# Patient Record
Sex: Male | Born: 1960
Health system: Southern US, Community
[De-identification: ages and names within clinical notes are randomized; demographics above are authoritative.]

## PROBLEM LIST (undated history)

## (undated) DIAGNOSIS — K635 Polyp of colon: Secondary | ICD-10-CM

## (undated) DIAGNOSIS — K219 Gastro-esophageal reflux disease without esophagitis: Secondary | ICD-10-CM

## (undated) HISTORY — DX: Gastro-esophageal reflux disease without esophagitis: K21.9

## (undated) HISTORY — DX: Polyp of colon: K63.5

---

## 2011-05-10 DIAGNOSIS — K219 Gastro-esophageal reflux disease without esophagitis: Secondary | ICD-10-CM | POA: Insufficient documentation

## 2011-05-10 DIAGNOSIS — K635 Polyp of colon: Secondary | ICD-10-CM | POA: Insufficient documentation

## 2011-05-10 DIAGNOSIS — Z720 Tobacco use: Secondary | ICD-10-CM | POA: Insufficient documentation

## 2015-07-24 ENCOUNTER — Encounter: Payer: Self-pay | Admitting: Family Medicine

## 2015-07-24 ENCOUNTER — Ambulatory Visit (INDEPENDENT_AMBULATORY_CARE_PROVIDER_SITE_OTHER): Payer: BLUE CROSS/BLUE SHIELD | Admitting: Family Medicine

## 2015-07-24 VITALS — BP 148/92 | HR 103 | Temp 97.8°F | Ht 71.0 in | Wt 152.0 lb

## 2015-07-24 DIAGNOSIS — Z1211 Encounter for screening for malignant neoplasm of colon: Secondary | ICD-10-CM

## 2015-07-24 DIAGNOSIS — D229 Melanocytic nevi, unspecified: Secondary | ICD-10-CM

## 2015-07-24 DIAGNOSIS — Z7189 Other specified counseling: Secondary | ICD-10-CM | POA: Diagnosis not present

## 2015-07-24 DIAGNOSIS — Z7689 Persons encountering health services in other specified circumstances: Secondary | ICD-10-CM

## 2015-07-24 DIAGNOSIS — K219 Gastro-esophageal reflux disease without esophagitis: Secondary | ICD-10-CM

## 2015-07-24 LAB — HEMOCCULT GUIAC POC 1CARD (OFFICE): FECAL OCCULT BLD: NEGATIVE

## 2015-07-24 MED ORDER — FAMOTIDINE 10 MG PO TABS: 10.0000 mg | ORAL_TABLET | Freq: Every day | ORAL | Status: DC

## 2015-07-24 NOTE — Progress Notes (Signed)
Name: Paul Logan   MRN: [id number]    DOB: [birth year]   Date:07/24/2015       Progress Note  Subjective  Chief Complaint  Chief Complaint  Patient presents with  . Establish Care    hasn't had a PCP in many years  . Mole    on right leg, has been there several years, it's changed. Would like it checked.    HPI Comments: Patient to establish primary care.   No problem-specific assessment & plan notes found for this encounter.   Past Medical History  Diagnosis Date  . GERD (gastroesophageal reflux disease)   . Colon polyp, hyperplastic     History reviewed. No pertinent past surgical history.  Family History  Problem Relation Age of Onset  . Cervical cancer Mother   . Hypertension Mother   . Cancer Mother     cervical  . Emphysema Mother   . Hypothyroidism Father   . Hypertension Father   . COPD Father   . Heart disease Father     pacemaker  . Stroke Father   . Prostate cancer Maternal Uncle   . Hypertension Maternal Grandfather   . Stroke Maternal Grandfather   . Heart disease Paternal Grandfather   . Diabetes Neg Hx     Social History   Social History  . Marital Status: Single    Spouse Name: N/A  . Number of Children: N/A  . Years of Education: N/A   Occupational History  . Not on file.   Social History Main Topics  . Smoking status: Current Every Day Smoker -- 1.50 packs/day    Types: Cigarettes  . Smokeless tobacco: Never Used  . Alcohol Use: 18.0 oz/week    30 Cans of beer per week  . Drug Use: No  . Sexual Activity: Not on file   Other Topics Concern  . Not on file   Social History Narrative  . No narrative on file    No Known Allergies   Review of Systems  Constitutional: Negative for fever, chills, weight loss and malaise/fatigue.  HENT: Negative for ear discharge, ear pain and sore throat.   Eyes: Negative for blurred vision.  Respiratory: Negative for cough, sputum production, shortness of breath and wheezing.    Cardiovascular: Negative for chest pain, palpitations and leg swelling.  Gastrointestinal: Negative for heartburn, nausea, abdominal pain, diarrhea, constipation, blood in stool and melena.  Genitourinary: Negative for dysuria, urgency, frequency and hematuria.  Musculoskeletal: Negative for myalgias, back pain, joint pain and neck pain.  Skin: Negative for rash.  Neurological: Negative for dizziness, tingling, sensory change, focal weakness and headaches.  Endo/Heme/Allergies: Negative for environmental allergies and polydipsia. Does not bruise/bleed easily.  Psychiatric/Behavioral: Negative for depression and suicidal ideas. The patient is not nervous/anxious and does not have insomnia.      Objective  Filed Vitals:   07/24/15 1504  BP: 162/98  Pulse: 103  Temp: 97.8 F (36.6 C)  Weight: 152 lb (68.947 kg)  SpO2: 99%    Physical Exam  Constitutional: He is oriented to person, place, and time and well-developed, well-nourished, and in no distress.  HENT:  Head: Normocephalic.  Right Ear: External ear normal.  Left Ear: External ear normal.  Nose: Nose normal.  Mouth/Throat: Oropharynx is clear and moist.  Eyes: Conjunctivae and EOM are normal. Pupils are equal, round, and reactive to light. Right eye exhibits no discharge. Left eye exhibits no discharge. No scleral icterus.  Neck: Normal range of motion. Neck  supple. No JVD present. No tracheal deviation present. No thyromegaly present.  Cardiovascular: Normal rate, regular rhythm, normal heart sounds and intact distal pulses.  Exam reveals no gallop and no friction rub.   No murmur heard. Pulmonary/Chest: Breath sounds normal. No respiratory distress. He has no wheezes. He has no rales.  Abdominal: Soft. Bowel sounds are normal. He exhibits no mass. There is no hepatosplenomegaly. There is no tenderness. There is no rebound, no guarding and no CVA tenderness.  Genitourinary: Prostate normal. Rectal exam shows external  hemorrhoid. Rectal exam shows no mass and no tenderness.  Musculoskeletal: Normal range of motion. He exhibits no edema or tenderness.  Lymphadenopathy:    He has no cervical adenopathy.  Neurological: He is alert and oriented to person, place, and time. He has normal sensation, normal strength, normal reflexes and intact cranial nerves. No cranial nerve deficit.  Skin: Skin is warm. Rash noted. Rash is nodular.  Bluish with irregularity right lower leg  Psychiatric: Mood and affect normal.  Nursing note and vitals reviewed.     Assessment & Plan  Problem List Items Addressed This Visit    None    Visit Diagnoses    Encounter to establish care with new doctor    -  Primary    Colon cancer screening        Relevant Orders    POCT Occult Blood Stool    Nevus        Relevant Orders    Ambulatory referral to Dermatology    Gastroesophageal reflux disease, esophagitis presence not specified        Relevant Medications    famotidine (PEPCID) 10 MG tablet         Dr. Thaddus Mcdowell [facility]  07/24/2015

## 2015-09-27 ENCOUNTER — Ambulatory Visit (INDEPENDENT_AMBULATORY_CARE_PROVIDER_SITE_OTHER): Payer: BLUE CROSS/BLUE SHIELD

## 2015-09-27 ENCOUNTER — Encounter: Payer: Self-pay | Admitting: Emergency Medicine

## 2015-09-27 ENCOUNTER — Ambulatory Visit
Admission: EM | Admit: 2015-09-27 | Discharge: 2015-09-27 | Disposition: A | Discharge status: Home / Self Care | Payer: BLUE CROSS/BLUE SHIELD | Attending: Family Medicine | Admitting: Family Medicine

## 2015-09-27 DIAGNOSIS — S0083XA Contusion of other part of head, initial encounter: Secondary | ICD-10-CM

## 2015-09-27 DIAGNOSIS — W19XXXA Unspecified fall, initial encounter: Secondary | ICD-10-CM

## 2015-09-27 DIAGNOSIS — S2231XA Fracture of one rib, right side, initial encounter for closed fracture: Secondary | ICD-10-CM

## 2015-09-27 MED ORDER — HYDROCODONE-ACETAMINOPHEN 5-325 MG PO TABS: ORAL_TABLET | ORAL | 0 refills | Status: DC

## 2015-09-27 NOTE — ED Triage Notes (Signed)
Patient states that he fell at home a week ago.  Patient thinks when he fell he knocked himself out.  Patient states that he does not remember falling.  Patient c/o some pain on the right side of his forehead and right sided rib pain.

## 2015-09-27 NOTE — ED Provider Notes (Signed)
[facility]    CSN: [id number] Arrival date & time: 09/27/15  1341  First Provider Contact:  None       History   Chief Complaint Chief Complaint  Patient presents with  . Fall  . Rib Pain    HPI Paul Logan is a 55 y.o. male.   Patient states that he fell at home a week ago. Patient c/o some pain on the right side of his forehead and right sided rib pain. First medical evaluation since falling at home one week ago. Denies any vision changes, vomiting, numbness/tingling, fevers, chills. Complains of pain to right lower ribs.    The history is provided by the patient.    Past Medical History:  Diagnosis Date  . Colon polyp, hyperplastic   . GERD (gastroesophageal reflux disease)     There are no active problems to display for this patient.   History reviewed. No pertinent surgical history.     Home Medications    Prior to Admission medications   Medication Sig Start Date End Date Taking? Authorizing Provider  famotidine (PEPCID) 10 MG tablet Take 1 tablet (10 mg total) by mouth daily. 07/24/15   Juline Patch, MD  HYDROcodone-acetaminophen (NORCO/VICODIN) 5-325 MG tablet 1-2 tabs po bid prn pain 09/27/15   Norval Gable, MD    Family History Family History  Problem Relation Age of Onset  . Cervical cancer Mother   . Hypertension Mother   . Cancer Mother     cervical  . Emphysema Mother   . Hypothyroidism Father   . Hypertension Father   . COPD Father   . Heart disease Father     pacemaker  . Stroke Father   . Prostate cancer Maternal Uncle   . Hypertension Maternal Grandfather   . Stroke Maternal Grandfather   . Heart disease Paternal Grandfather   . Diabetes Neg Hx     Social History Social History  Substance Use Topics  . Smoking status: Current Every Day Smoker    Packs/day: 1.50    Types: Cigarettes  . Smokeless tobacco: Never Used  . Alcohol use 18.0 oz/week    30 Cans of beer per week     Allergies   Review of  patient's allergies indicates no known allergies.   Review of Systems Review of Systems   Physical Exam Triage Vital Signs ED Triage Vitals  Enc Vitals Group     BP 09/27/15 1423 (!) 137/91     Pulse Rate 09/27/15 1423 87     Resp 09/27/15 1423 16     Temp 09/27/15 1423 97 F (36.1 C)     Temp Source 09/27/15 1423 Tympanic     SpO2 09/27/15 1423 100 %     Weight 09/27/15 1424 152 lb (68.9 kg)     Height 09/27/15 1424 6\' 2"  (1.88 m)     Head Circumference --      Peak Flow --      Pain Score 09/27/15 1425 7     Pain Loc --      Pain Edu? --      Excl. in Lane? --    No data found.   Updated Vital Signs BP (!) 137/91 (BP Location: Left Arm)   Pulse 87   Temp 97 F (36.1 C) (Tympanic)   Resp 16   Ht 6\' 2"  (1.88 m)   Wt 152 lb (68.9 kg)   SpO2 100%   BMI 19.52 kg/m   Visual  Acuity Right Eye Distance:   Left Eye Distance:   Bilateral Distance:    Right Eye Near:   Left Eye Near:    Bilateral Near:     Physical Exam  Constitutional: He is oriented to person, place, and time. He appears well-developed and well-nourished. No distress.  HENT:  Head: Normocephalic and atraumatic.  Right Ear: Tympanic membrane, external ear and ear canal normal.  Left Ear: Tympanic membrane, external ear and ear canal normal.  Nose: Nose normal.  Mouth/Throat: Uvula is midline, oropharynx is clear and moist and mucous membranes are normal. No oropharyngeal exudate or tonsillar abscesses.  Eyes: Conjunctivae and EOM are normal. Pupils are equal, round, and reactive to light. Right eye exhibits no discharge. Left eye exhibits no discharge. No scleral icterus.  Neck: Normal range of motion. Neck supple. No tracheal deviation present. No thyromegaly present.  Cardiovascular: Normal rate, regular rhythm and normal heart sounds.   Pulmonary/Chest: Effort normal and breath sounds normal. No stridor. No respiratory distress. He has no wheezes. He has no rales. He exhibits tenderness (over  right anterior lower ribs).  Lymphadenopathy:    He has no cervical adenopathy.  Neurological: He is alert and oriented to person, place, and time. He has normal reflexes. He displays normal reflexes. No cranial nerve deficit. He exhibits normal muscle tone. Coordination normal.  Skin: Skin is warm and dry. No rash noted. He is not diaphoretic.  Nursing note and vitals reviewed.    UC Treatments / Results  Labs (all labs ordered are listed, but only abnormal results are displayed) Labs Reviewed - No data to display  EKG  EKG Interpretation None       Radiology Dg Ribs Unilateral W/chest Right  Result Date: 09/27/2015 CLINICAL DATA:  Patient states that he fell at home a week ago. Patient thinks when he fell he knocked himself out. Patient does not remember falling. Patient c/o right sided rib pain. EXAM: RIGHT RIBS AND CHEST - 3+ VIEW COMPARISON:  None. FINDINGS: There are nondisplaced fractures of the anterior rib ends of the right ninth and tenth ribs. No other fractures. Lungs are hyperexpanded but clear. No pleural effusion or pneumothorax. Heart, mediastinum and hila are unremarkable. IMPRESSION: 1. Nondisplaced fractures of the anterior rib ends of the right ninth and tenth ribs. The no other fractures. 2. No acute cardiopulmonary disease. No pneumothorax or pleural effusion. Electronically Signed   By: Lajean Manes M.D.   On: 09/27/2015 15:23    Procedures Procedures (including critical care time)  Medications Ordered in UC Medications - No data to display   Initial Impression / Assessment and Plan / UC Course  I have reviewed the triage vital signs and the nursing notes.  Pertinent labs & imaging results that were available during my care of the patient were reviewed by me and considered in my medical decision making (see chart for details).  Clinical Course      Final Clinical Impressions(s) / UC Diagnoses   Final diagnoses:  Rib fracture, right, closed,  initial encounter  Fall, initial encounter  Facial contusion, initial encounter    New Prescriptions Discharge Medication List as of 09/27/2015  3:47 PM    START taking these medications   Details  HYDROcodone-acetaminophen (NORCO/VICODIN) 5-325 MG tablet 1-2 tabs po bid prn pain, Print       1.x-ray results and diagnosis reviewed with patient 2. rx as per orders above; reviewed possible side effects, interactions, risks and benefits  3. Recommend supportive  treatment with ice, rest, otc analgesics prn 4. Follow-up prn if symptoms worsen or don't improve   Norval Gable, MD 09/27/15 1625

## 2015-10-24 ENCOUNTER — Encounter: Payer: Self-pay | Admitting: Family Medicine

## 2015-10-24 ENCOUNTER — Ambulatory Visit (INDEPENDENT_AMBULATORY_CARE_PROVIDER_SITE_OTHER): Payer: BLUE CROSS/BLUE SHIELD | Admitting: Family Medicine

## 2015-10-24 VITALS — BP 120/80 | HR 64 | Ht 73.0 in | Wt 156.0 lb

## 2015-10-24 DIAGNOSIS — Z0000 Encounter for general adult medical examination without abnormal findings: Secondary | ICD-10-CM

## 2015-10-24 DIAGNOSIS — Z1211 Encounter for screening for malignant neoplasm of colon: Secondary | ICD-10-CM

## 2015-10-24 DIAGNOSIS — Z23 Encounter for immunization: Secondary | ICD-10-CM

## 2015-10-24 LAB — HEMOCCULT GUIAC POC 1CARD (OFFICE): FECAL OCCULT BLD: NEGATIVE

## 2015-10-24 NOTE — Progress Notes (Signed)
Name: Paul Logan   MRN: [id number]    DOB: 1960-02-21   Date:10/24/2015       Progress Note  Subjective  Chief Complaint  Chief Complaint  Patient presents with  . Annual Exam    needs repeat colonoscopy in [place]- last was with Tollie Pizza at [facility]    Patient presents for annual physical exam.    No problem-specific Assessment & Plan notes found for this encounter.   Past Medical History:  Diagnosis Date  . Colon polyp, hyperplastic   . GERD (gastroesophageal reflux disease)     History reviewed. No pertinent surgical history.  Family History  Problem Relation Age of Onset  . Cervical cancer Mother   . Hypertension Mother   . Cancer Mother     cervical  . Emphysema Mother   . Hypothyroidism Father   . Hypertension Father   . COPD Father   . Heart disease Father     pacemaker  . Stroke Father   . Prostate cancer Maternal Uncle   . Hypertension Maternal Grandfather   . Stroke Maternal Grandfather   . Heart disease Paternal Grandfather   . Diabetes Neg Hx     Social History   Social History  . Marital status: Single    Spouse name: N/A  . Number of children: N/A  . Years of education: N/A   Occupational History  . Not on file.   Social History Main Topics  . Smoking status: Current Every Day Smoker    Packs/day: 1.50    Types: Cigarettes  . Smokeless tobacco: Never Used  . Alcohol use 18.0 oz/week    30 Cans of beer per week  . Drug use: No  . Sexual activity: Not on file   Other Topics Concern  . Not on file   Social History Narrative  . No narrative on file    No Known Allergies   Review of Systems  Constitutional: Negative for chills, fever, malaise/fatigue and weight loss.  HENT: Negative for ear discharge, ear pain and sore throat.   Eyes: Negative for blurred vision.  Respiratory: Negative for cough, sputum production, shortness of breath and wheezing.   Cardiovascular: Negative for chest pain, palpitations and  leg swelling.  Gastrointestinal: Negative for abdominal pain, blood in stool, constipation, diarrhea, heartburn, melena and nausea.  Genitourinary: Negative for dysuria, frequency, hematuria and urgency.  Musculoskeletal: Negative for back pain, joint pain, myalgias and neck pain.  Skin: Negative for rash.  Neurological: Negative for dizziness, tingling, sensory change, focal weakness and headaches.  Endo/Heme/Allergies: Negative for environmental allergies and polydipsia. Does not bruise/bleed easily.  Psychiatric/Behavioral: Negative for depression and suicidal ideas. The patient is not nervous/anxious and does not have insomnia.      Objective  Vitals:   10/24/15 0955  BP: 120/80  Pulse: 64  Weight: 156 lb (70.8 kg)  Height: 6\' 1"  (1.854 m)    Physical Exam  Constitutional: He is oriented to person, place, and time and well-developed, well-nourished, and in no distress.  HENT:  Head: Normocephalic.  Right Ear: Tympanic membrane, external ear and ear canal normal.  Left Ear: Tympanic membrane, external ear and ear canal normal.  Nose: Nose normal.  Mouth/Throat: Uvula is midline, oropharynx is clear and moist and mucous membranes are normal.  Eyes: Conjunctivae, EOM and lids are normal. Pupils are equal, round, and reactive to light. Right eye exhibits no discharge. Left eye exhibits no discharge. No scleral icterus.  Fundoscopic exam:  The right eye shows no arteriolar narrowing, no AV nicking and no hemorrhage.       The left eye shows no arteriolar narrowing, no AV nicking and no hemorrhage.  Neck: Trachea normal and normal range of motion. Neck supple. Normal carotid pulses, no hepatojugular reflux and no JVD present. Carotid bruit is not present. No tracheal deviation present. No thyromegaly present.  Cardiovascular: Normal rate, regular rhythm, S1 normal, S2 normal, normal heart sounds, intact distal pulses and normal pulses.  Exam reveals no gallop, no S3, no S4 and no  friction rub.   No murmur heard. Pulmonary/Chest: Effort normal and breath sounds normal. No respiratory distress. He has no wheezes. He has no rales. Right breast exhibits no mass. Left breast exhibits no mass.  Abdominal: Soft. Normal aorta and bowel sounds are normal. He exhibits no mass. There is no hepatosplenomegaly. There is no tenderness. There is no rebound, no guarding and no CVA tenderness.  Genitourinary: Rectum normal, prostate normal and testes/scrotum normal. Rectal exam shows guaiac negative stool.  Musculoskeletal: Normal range of motion. He exhibits no edema or tenderness.  Lymphadenopathy:       Head (right side): No submandibular adenopathy present.       Head (left side): No submandibular adenopathy present.    He has no cervical adenopathy.    He has no axillary adenopathy.  Neurological: He is alert and oriented to person, place, and time. He has normal motor skills, normal sensation, normal strength, normal reflexes and intact cranial nerves. No cranial nerve deficit.  Skin: Skin is warm. No rash noted. No erythema. No pallor.  Psychiatric: Mood, memory and affect normal.  Nursing note and vitals reviewed.     Assessment & Plan  Problem List Items Addressed This Visit    None    Visit Diagnoses    Annual physical exam    -  Primary   Relevant Orders   Renal Function Panel   Lipid Profile   POCT Occult Blood Stool   POCT urinalysis dipstick   Immunization due       Relevant Orders   Flu Vaccine QUAD 36+ mos PF IM (Fluarix & Fluzone Quad PF) (Completed)   Colon cancer screening       Relevant Orders   POCT Occult Blood Stool   Ambulatory referral to Gastroenterology        Dr. Otilio Miu [facility]  10/24/15

## 2015-10-25 LAB — RENAL FUNCTION PANEL
Albumin: 3.9 g/dL (ref 3.5–5.5)
BUN / CREAT RATIO: 8 — AB (ref 9–20)
BUN: 7 mg/dL (ref 6–24)
CO2: 26 mmol/L (ref 18–29)
CREATININE: 0.9 mg/dL (ref 0.76–1.27)
Calcium: 8.6 mg/dL — ABNORMAL LOW (ref 8.7–10.2)
Chloride: 99 mmol/L (ref 96–106)
GFR, EST AFRICAN AMERICAN: 110 mL/min/{1.73_m2} (ref >59)
GFR, EST NON AFRICAN AMERICAN: 95 mL/min/{1.73_m2} (ref >59)
GLUCOSE: 85 mg/dL (ref 65–99)
POTASSIUM: 4.3 mmol/L (ref 3.5–5.2)
Phosphorus: 4 mg/dL (ref 2.5–4.5)
SODIUM: 139 mmol/L (ref 134–144)

## 2015-10-25 LAB — LIPID PANEL
CHOL/HDL RATIO: 1.6 ratio (ref 0.0–5.0)
Cholesterol, Total: 164 mg/dL (ref 100–199)
HDL: 105 mg/dL (ref >39)
LDL CALC: 51 mg/dL (ref 0–99)
Triglycerides: 40 mg/dL (ref 0–149)
VLDL Cholesterol Cal: 8 mg/dL (ref 5–40)

## 2017-10-25 ENCOUNTER — Encounter: Payer: Self-pay | Admitting: Family Medicine

## 2017-10-25 ENCOUNTER — Ambulatory Visit: Payer: BLUE CROSS/BLUE SHIELD | Admitting: Family Medicine

## 2017-10-25 VITALS — BP 138/80 | HR 80 | Ht 73.0 in | Wt 153.0 lb

## 2017-10-25 DIAGNOSIS — Z23 Encounter for immunization: Secondary | ICD-10-CM | POA: Diagnosis not present

## 2017-10-25 DIAGNOSIS — F17200 Nicotine dependence, unspecified, uncomplicated: Secondary | ICD-10-CM

## 2017-10-25 DIAGNOSIS — K219 Gastro-esophageal reflux disease without esophagitis: Secondary | ICD-10-CM

## 2017-10-25 DIAGNOSIS — J029 Acute pharyngitis, unspecified: Secondary | ICD-10-CM | POA: Diagnosis not present

## 2017-10-25 DIAGNOSIS — J0100 Acute maxillary sinusitis, unspecified: Secondary | ICD-10-CM | POA: Diagnosis not present

## 2017-10-25 MED ORDER — AMOXICILLIN 500 MG PO CAPS: 500.0000 mg | ORAL_CAPSULE | Freq: Three times a day (TID) | ORAL | 0 refills | Status: DC

## 2017-10-25 MED ORDER — PANTOPRAZOLE SODIUM 40 MG PO TBEC: 40.0000 mg | DELAYED_RELEASE_TABLET | Freq: Every day | ORAL | 3 refills | Status: DC

## 2017-10-25 NOTE — Addendum Note (Signed)
Addended by: Juline Patch on: 10/25/2017 04:30 PM   Modules accepted: Orders, Level of Service

## 2017-10-25 NOTE — Progress Notes (Signed)
Date:  10/25/2017   Name:  Paul Logan   DOB:  05-17-1960   MRN:  [id number]   Chief Complaint: Establish Care; Sore Throat (achy throat, feels a "dull ache" no trouble swallowing food); and Flu Vaccine Sore Throat   This is a new problem. The current episode started 1 to 4 weeks ago (2 weeks). The problem has been unchanged. The pain is worse on the right side. There has been no fever. The pain is moderate. Associated symptoms include abdominal pain. Pertinent negatives include no congestion, coughing, diarrhea, drooling, ear discharge, ear pain, headaches, hoarse voice, plugged ear sensation, neck pain, shortness of breath, stridor, swollen glands, trouble swallowing or vomiting. He has tried nothing for the symptoms.     Review of Systems  Constitutional: Negative for chills and fever.  HENT: Negative for congestion, drooling, ear discharge, ear pain, hoarse voice, sore throat and trouble swallowing.   Respiratory: Negative for cough, shortness of breath, wheezing and stridor.   Cardiovascular: Negative for chest pain, palpitations and leg swelling.  Gastrointestinal: Positive for abdominal pain. Negative for blood in stool, constipation, diarrhea, nausea and vomiting.  Endocrine: Negative for polydipsia.  Genitourinary: Negative for dysuria, frequency, hematuria and urgency.  Musculoskeletal: Negative for back pain, myalgias and neck pain.  Skin: Positive for color change. Negative for rash.       Depigmented right arm and head  Allergic/Immunologic: Negative for environmental allergies.  Neurological: Negative for dizziness and headaches.  Hematological: Does not bruise/bleed easily.  Psychiatric/Behavioral: Negative for suicidal ideas. The patient is not nervous/anxious.     There are no active problems to display for this patient.   No Known Allergies  No past surgical history on file.  Social History   Tobacco Use  . Smoking status: Current Every Day Smoker   Packs/day: 1.50    Types: Cigarettes  . Smokeless tobacco: Never Used  . Tobacco comment: info given on pills and patches  Substance Use Topics  . Alcohol use: Yes    Alcohol/week: 30.0 standard drinks    Types: 30 Cans of beer per week  . Drug use: No     Medication list has been reviewed and updated.  Current Meds  Medication Sig  . famotidine (PEPCID) 10 MG tablet Take 1 tablet (10 mg total) by mouth daily.    PHQ 2/9 Scores 10/25/2017  PHQ - 2 Score 0  PHQ- 9 Score 1    Physical Exam  Constitutional: He is oriented to person, place, and time. He appears well-developed and well-nourished.  HENT:  Head: Normocephalic.  Right Ear: Hearing, external ear and ear canal normal. Tympanic membrane is retracted.  Left Ear: Hearing, tympanic membrane, external ear and ear canal normal.  Nose: Nose normal. No mucosal edema or rhinorrhea. Right sinus exhibits no maxillary sinus tenderness. Left sinus exhibits no maxillary sinus tenderness.  Mouth/Throat: Uvula is midline. Posterior oropharyngeal erythema present. No oropharyngeal exudate or posterior oropharyngeal edema.  Eyes: Pupils are equal, round, and reactive to light. Conjunctivae and EOM are normal. Right eye exhibits no discharge. Left eye exhibits no discharge. No scleral icterus.  Neck: Normal range of motion. Neck supple. No JVD present. No tracheal deviation present. No thyromegaly present.  Cardiovascular: Normal rate, regular rhythm, S1 normal, S2 normal, normal heart sounds and intact distal pulses. Exam reveals no gallop, no S3, no S4 and no friction rub.  No murmur heard. Pulmonary/Chest: Breath sounds normal. No respiratory distress. He has no decreased breath sounds.  He has no wheezes. He has no rales.  Abdominal: Soft. Bowel sounds are normal. He exhibits no mass. There is no hepatosplenomegaly. There is no tenderness. There is no rebound, no guarding and no CVA tenderness.  Musculoskeletal: Normal range of motion.  He exhibits no edema or tenderness.  Lymphadenopathy:       Head (right side): No submental, no submandibular and no tonsillar adenopathy present.       Head (left side): No submental and no submandibular adenopathy present.    He has no cervical adenopathy.       Right cervical: No superficial cervical and no posterior cervical adenopathy present.      Left cervical: No superficial cervical and no posterior cervical adenopathy present.  Neurological: He is alert and oriented to person, place, and time. He has normal strength and normal reflexes. No cranial nerve deficit.  Skin: Skin is warm. No rash noted.  Nursing note and vitals reviewed.   BP 138/80   Pulse 80   Ht 6\' 1"  (1.854 m)   Wt 153 lb (69.4 kg)   BMI 20.19 kg/m   Assessment and Plan:  1. Acute maxillary sinusitis, recurrence not specified Acute. Postnasal drainage. Prescribe amoxil 500 mg tid. - amoxicillin (AMOXIL) 500 MG capsule; Take 1 capsule (500 mg total) by mouth 3 (three) times daily.  Dispense: 30 capsule; Refill: 0  2. Tobacco dependence Patient has been advised of the health risks of smoking and counseled concerning cessation of tobacco products. I spent over 3 minutes for discussion and to answer questions.  3. Pharyngitis, unspecified etiology Mild erythema. Trial amoxil  4. Gastroesophageal reflux disease, esophagitis presence not specified GERD insufficiently treated with pepcid. Switch to pantoprozole 40 mg daily. - pantoprazole (PROTONIX) 40 MG tablet; Take 1 tablet (40 mg total) by mouth daily.  Dispense: 30 tablet; Refill: 3  Dr. [facility]  10/25/2017

## 2017-11-10 ENCOUNTER — Other Ambulatory Visit: Payer: Self-pay

## 2017-11-10 DIAGNOSIS — H6980 Other specified disorders of Eustachian tube, unspecified ear: Secondary | ICD-10-CM

## 2018-04-04 IMAGING — CR DG RIBS W/ CHEST 3+V*R*
4 series · 4 of 4 positions shown · non-contrast
Comparison: None.

CLINICAL DATA: Patient states that he fell at home a week ago.
remember falling. Patient c/o right sided rib pain.

EXAM:
RIGHT RIBS AND CHEST - 3+ VIEW

[chest pa (1 of 2)]
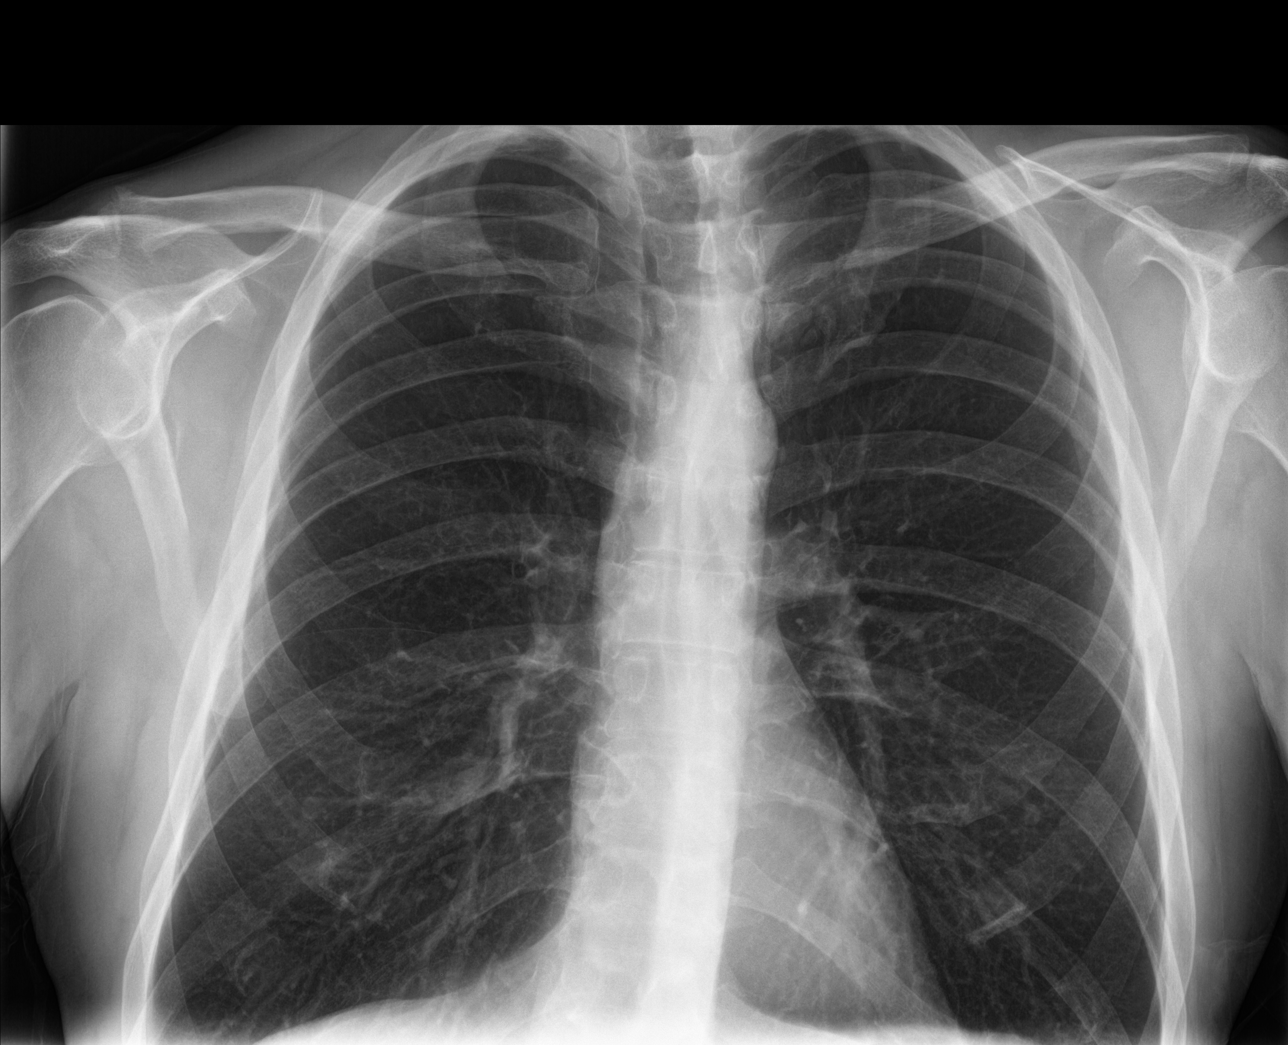

[rib pa]
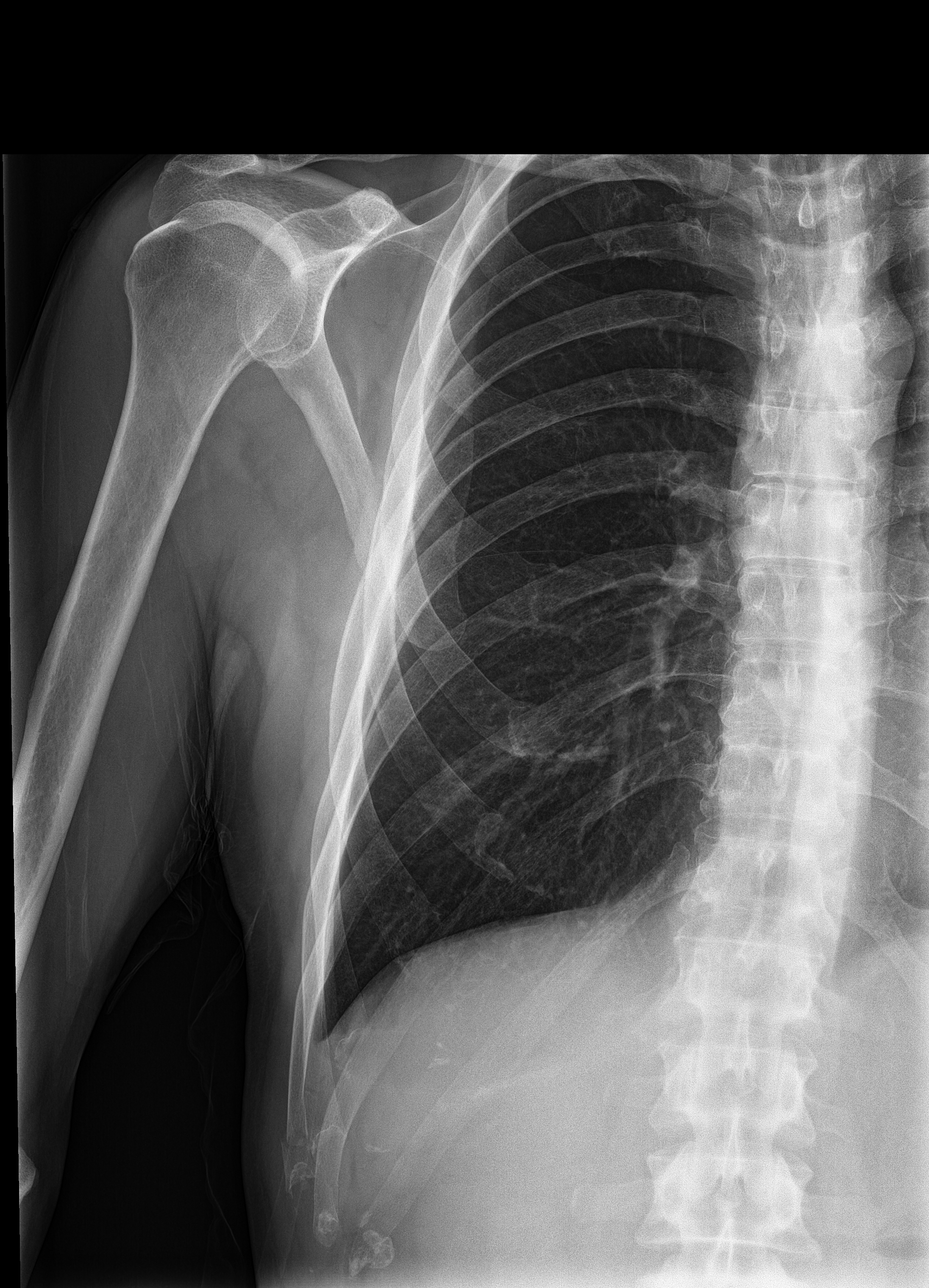

[rib obl]
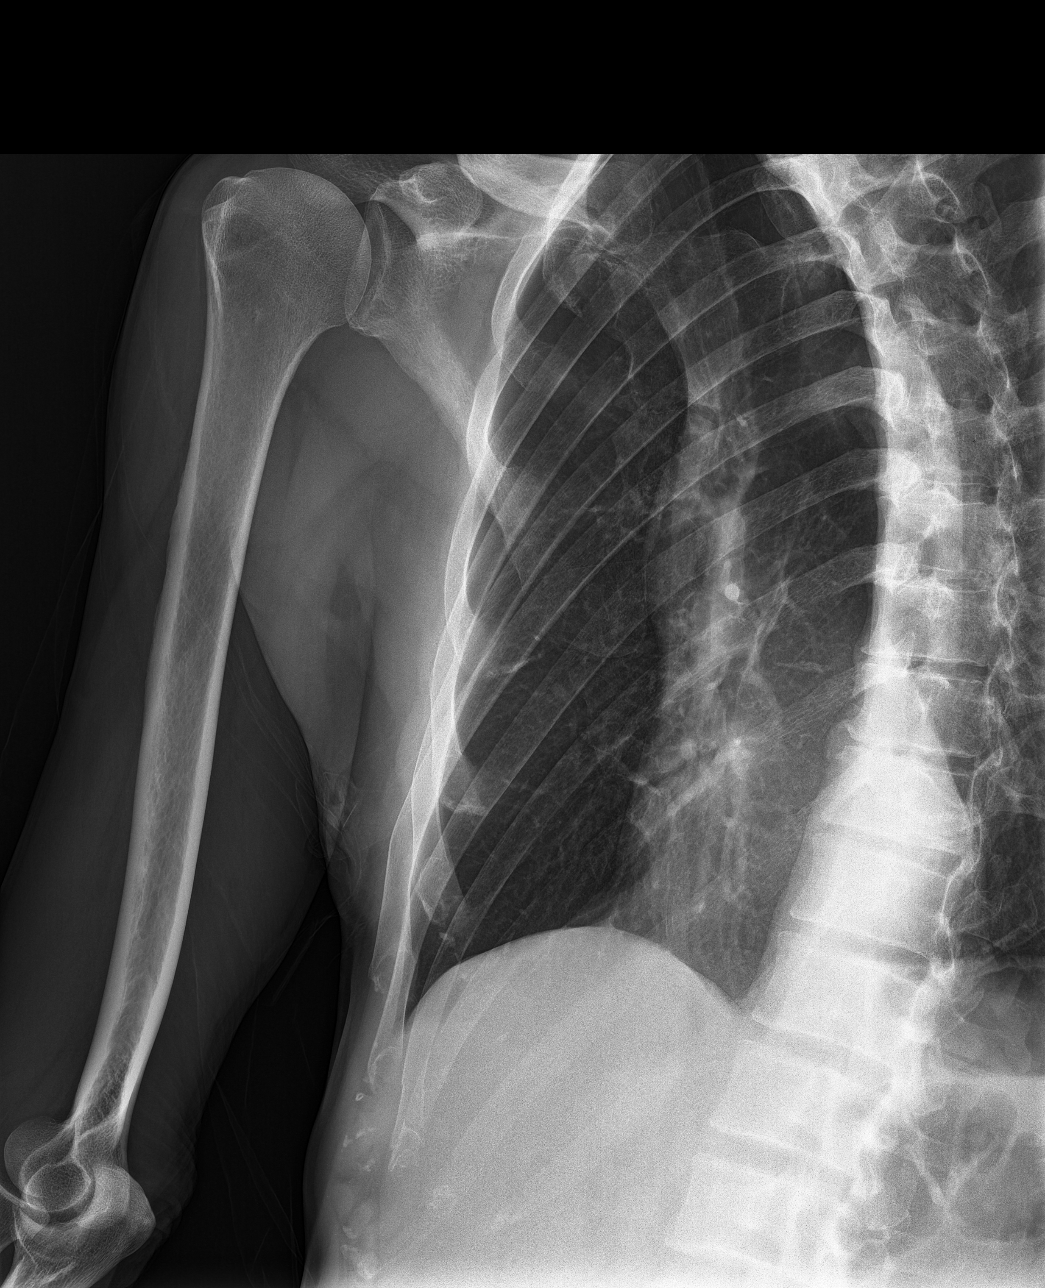

[chest pa (2 of 2)]
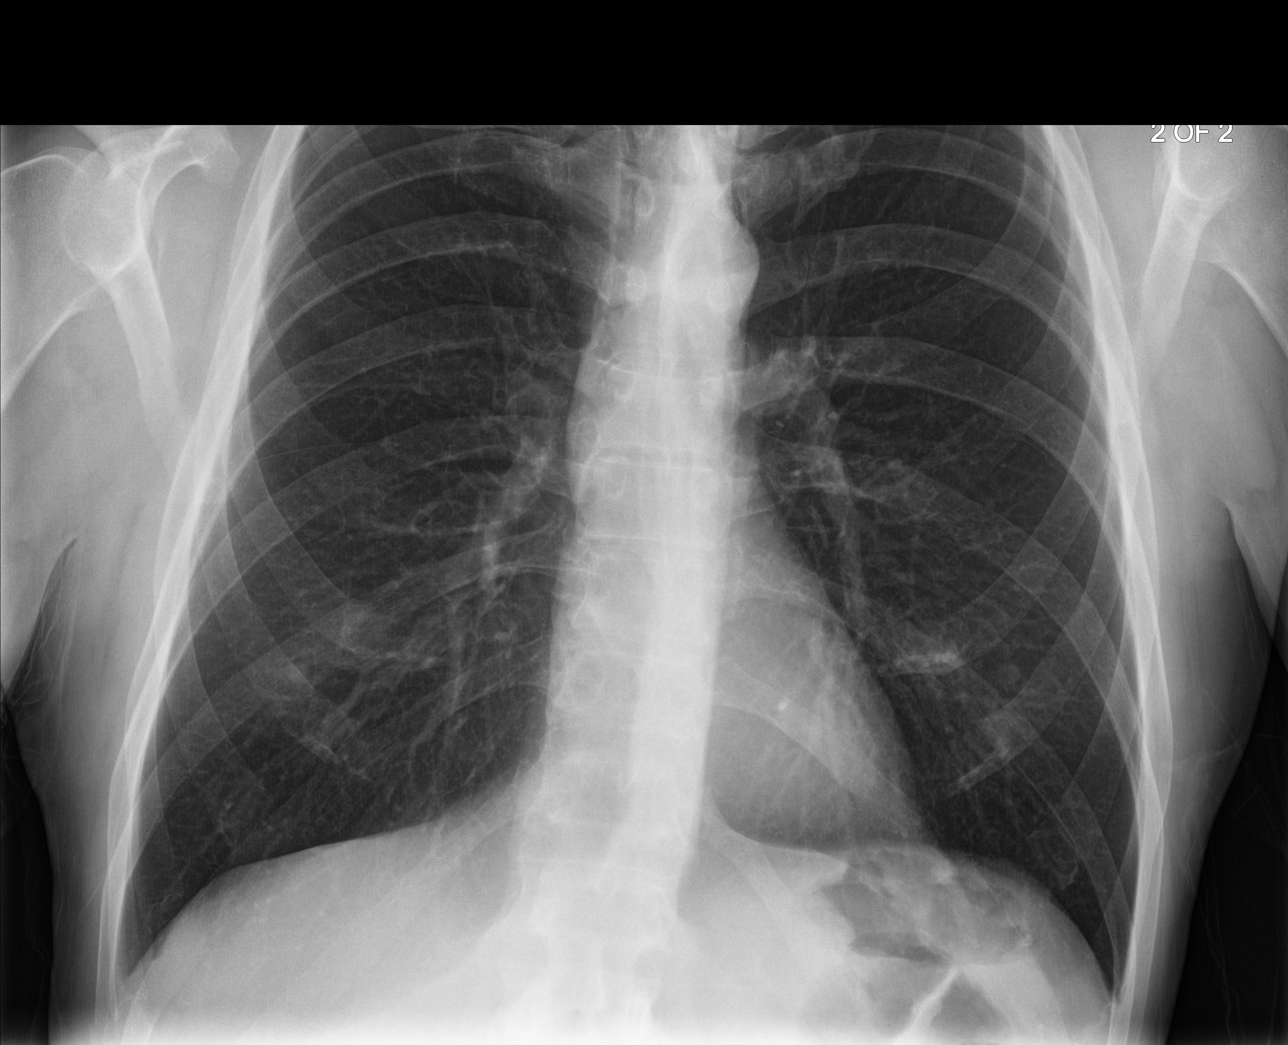

[4 of 4 positions shown; findings below may reference images not displayed]

FINDINGS: There are nondisplaced fractures of the anterior rib ends of the
right ninth and tenth ribs. No other fractures.

Lungs are hyperexpanded but clear. No pleural effusion or
pneumothorax.

Heart, mediastinum and hila are unremarkable.
IMPRESSION: 1. Nondisplaced fractures of the anterior rib ends of the right
ninth and tenth ribs. The no other fractures.
2. No acute cardiopulmonary disease. No pneumothorax or pleural
effusion.

## 2019-08-03 ENCOUNTER — Other Ambulatory Visit: Payer: Self-pay

## 2019-08-03 ENCOUNTER — Encounter: Payer: Self-pay | Admitting: Family Medicine

## 2019-08-03 ENCOUNTER — Ambulatory Visit (INDEPENDENT_AMBULATORY_CARE_PROVIDER_SITE_OTHER): Payer: Self-pay | Admitting: Family Medicine

## 2019-08-03 VITALS — BP 146/92 | HR 76 | Ht 73.0 in | Wt 150.0 lb

## 2019-08-03 DIAGNOSIS — K219 Gastro-esophageal reflux disease without esophagitis: Secondary | ICD-10-CM

## 2019-08-03 DIAGNOSIS — R1031 Right lower quadrant pain: Secondary | ICD-10-CM

## 2019-08-03 DIAGNOSIS — Z789 Other specified health status: Secondary | ICD-10-CM

## 2019-08-03 DIAGNOSIS — R195 Other fecal abnormalities: Secondary | ICD-10-CM

## 2019-08-03 MED ORDER — PANTOPRAZOLE SODIUM 40 MG PO TBEC: 40.0000 mg | DELAYED_RELEASE_TABLET | Freq: Every day | ORAL | 3 refills | Status: DC

## 2019-08-03 NOTE — Progress Notes (Signed)
Date:  08/03/2019   Name:  Paul Logan   DOB:  [birth year]   MRN:  [id number]   Chief Complaint: Abdominal Pain (Abdominal pain on the RUQ. On and off. Said he knows he is due for a colonoscopy. )  Abdominal Pain This is a new problem. The current episode started more than 1 year ago (18 months). The onset quality is gradual. The problem occurs intermittently. The problem has been unchanged. The pain is located in the RLQ. The pain is at a severity of 2/10. The pain is mild. The quality of the pain is aching. The abdominal pain does not radiate. Pertinent negatives include no constipation, diarrhea, dysuria, fever, frequency, headaches, hematochezia, hematuria, melena, myalgias, nausea or vomiting. Prior diagnostic workup includes lower endoscopy (noted polyps).    Lab Results  Component Value Date   CREATININE 0.90 10/24/2015   BUN 7 10/24/2015   NA 139 10/24/2015   K 4.3 10/24/2015   CL 99 10/24/2015   CO2 26 10/24/2015   Lab Results  Component Value Date   CHOL 164 10/24/2015   HDL 105 10/24/2015   LDLCALC 51 10/24/2015   TRIG 40 10/24/2015   CHOLHDL 1.6 10/24/2015   No results found for: TSH No results found for: HGBA1C No results found for: WBC, HGB, HCT, MCV, PLT No results found for: ALT, AST, GGT, ALKPHOS, BILITOT   Review of Systems  Constitutional: Negative for chills and fever.  HENT: Negative for drooling, ear discharge, ear pain and sore throat.   Respiratory: Negative for cough, shortness of breath and wheezing.   Cardiovascular: Negative for chest pain, palpitations and leg swelling.  Gastrointestinal: Positive for abdominal pain. Negative for blood in stool, constipation, diarrhea, hematochezia, melena, nausea and vomiting.  Endocrine: Negative for polydipsia.  Genitourinary: Negative for dysuria, frequency, hematuria and urgency.  Musculoskeletal: Negative for back pain, myalgias and neck pain.  Skin: Negative for rash.  Allergic/Immunologic:  Negative for environmental allergies.  Neurological: Negative for dizziness and headaches.  Hematological: Does not bruise/bleed easily.  Psychiatric/Behavioral: Negative for suicidal ideas. The patient is not nervous/anxious.     Patient Active Problem List   Diagnosis Date Noted   Colon polyp, hyperplastic 05/10/2011   Gastro-esophageal reflux disease without esophagitis 05/10/2011   Tobacco abuse 05/10/2011    No Known Allergies  History reviewed. No pertinent surgical history.  Social History   Tobacco Use   Smoking status: Current Every Day Smoker    Packs/day: 1.50    Types: Cigarettes   Smokeless tobacco: Never Used   Tobacco comment: info given on pills and patches  Substance Use Topics   Alcohol use: Yes    Alcohol/week: 30.0 standard drinks    Types: 30 Cans of beer per week   Drug use: No     Medication list has been reviewed and updated.  No outpatient medications have been marked as taking for the 08/03/19 encounter (Office Visit) with Juline Patch, MD.    [facility] 2/9 Scores 08/03/2019 10/25/2017  PHQ - 2 Score 0 0  PHQ- 9 Score 0 1    GAD 7 : Generalized Anxiety Score 08/03/2019  Nervous, Anxious, on Edge 0  Control/stop worrying 0  Worry too much - different things 0  Trouble relaxing 0  Restless 0  Easily annoyed or irritable 0  Afraid - awful might happen 0  Total GAD 7 Score 0  Anxiety Difficulty Not difficult at all    BP Readings from Last 3  Encounters:  08/03/19 (!) 146/92  10/25/17 138/80  10/24/15 120/80    Physical Exam Vitals and nursing note reviewed.  HENT:     Head: Normocephalic.     Jaw: There is normal jaw occlusion.     Right Ear: Tympanic membrane and external ear normal.     Left Ear: Tympanic membrane and external ear normal.     Nose: Nose normal.     Mouth/Throat:     Lips: Pink.     Mouth: Mucous membranes are moist.  Eyes:     General: Lids are normal. No scleral icterus.       Right eye: No  discharge.        Left eye: No discharge.     Conjunctiva/sclera: Conjunctivae normal.     Pupils: Pupils are equal, round, and reactive to light.  Neck:     Thyroid: No thyromegaly.     Vascular: No JVD.     Trachea: No tracheal deviation.  Cardiovascular:     Rate and Rhythm: Normal rate and regular rhythm.     Chest Wall: PMI is not displaced.     Heart sounds: Normal heart sounds, S1 normal and S2 normal. No murmur heard.  No systolic murmur is present.  No diastolic murmur is present.  No friction rub. No gallop. No S3 or S4 sounds.   Pulmonary:     Effort: No respiratory distress.     Breath sounds: Normal breath sounds. No wheezing or rales.  Abdominal:     General: Abdomen is flat. Bowel sounds are normal.     Palpations: Abdomen is soft. There is no hepatomegaly, splenomegaly or mass.     Tenderness: There is no abdominal tenderness. There is no right CVA tenderness, left CVA tenderness, guarding or rebound.  Genitourinary:    Prostate: Normal.     Rectum: Normal. Guaiac result positive.  Musculoskeletal:        General: No tenderness. Normal range of motion.     Cervical back: Normal range of motion and neck supple.  Lymphadenopathy:     Cervical: No cervical adenopathy.  Skin:    General: Skin is warm.     Findings: No rash.  Neurological:     Mental Status: He is alert and oriented to person, place, and time.     Cranial Nerves: No cranial nerve deficit.     Deep Tendon Reflexes: Reflexes are normal and symmetric.     Wt Readings from Last 3 Encounters:  08/03/19 150 lb (68 kg)  10/25/17 153 lb (69.4 kg)  10/24/15 156 lb (70.8 kg)    BP (!) 146/92    Pulse 76    Ht 6\' 1"  (1.854 m)    Wt 150 lb (68 kg)    SpO2 98%    BMI 19.79 kg/m   Assessment and Plan: 1. Right lower quadrant abdominal pain New onset.  Persistent.  Primarily in right lower quadrant.  Exam notes minimal tenderness without guarding or rebound.  Will check CBC hepatic function panel and  lipase as well as referral to gastroenterology. - Ambulatory referral to Gastroenterology - CBC with Differential/Platelet - Hepatic Function Panel (6) - Lipase  2. Guaiac positive stools Patient was noted to have a guaiac positive stool and had a previous colonoscopy with multiple polyps by Dr. Donna Christen in [place] several years ago.  Patient missed colonoscopy last scheduled.  Will reschedule for GI consult for evaluation treatment and possible colonoscopy possible endoscopy if CBC is  sufficiently low because of patient's history of alcohol use. - Ambulatory referral to Gastroenterology - CBC with Differential/Platelet - Hepatic Function Panel (6) - Lipase  3. Gastro-esophageal reflux disease without esophagitis Chronic.  Uncontrolled.  Stable.  Because of the guaiac positive nature of the stool and discomfort and alcohol on a daily intake patient will be started on pantoprazole prior for evaluation. - pantoprazole (PROTONIX) 40 MG tablet; Take 1 tablet (40 mg total) by mouth daily.  Dispense: 30 tablet; Refill: 3  4. Daily consumption of alcohol This is duly noted with patient and states that his primarily beer but there is concern of possibility of pancreatitis versus hepatic concerns.

## 2019-08-04 LAB — CBC WITH DIFFERENTIAL/PLATELET
Basophils Absolute: 0 10*3/uL (ref 0.0–0.2)
Basos: 1 %
EOS (ABSOLUTE): 0 10*3/uL (ref 0.0–0.4)
Eos: 1 %
Hematocrit: 48.9 % (ref 37.5–51.0)
Hemoglobin: 16.6 g/dL (ref 13.0–17.7)
Immature Grans (Abs): 0 10*3/uL (ref 0.0–0.1)
Immature Granulocytes: 1 %
Lymphocytes Absolute: 0.9 10*3/uL (ref 0.7–3.1)
Lymphs: 22 %
MCH: 32.5 pg (ref 26.6–33.0)
MCHC: 33.9 g/dL (ref 31.5–35.7)
MCV: 96 fL (ref 79–97)
Monocytes Absolute: 0.6 10*3/uL (ref 0.1–0.9)
Monocytes: 15 %
Neutrophils Absolute: 2.4 10*3/uL (ref 1.4–7.0)
Neutrophils: 60 %
Platelets: 113 10*3/uL — ABNORMAL LOW (ref 150–450)
RBC: 5.1 x10E6/uL (ref 4.14–5.80)
RDW: 11.7 % (ref 11.6–15.4)
WBC: 4 10*3/uL (ref 3.4–10.8)

## 2019-08-04 LAB — HEPATIC FUNCTION PANEL (6)
ALT: 77 IU/L — ABNORMAL HIGH (ref 0–44)
AST: 70 IU/L — ABNORMAL HIGH (ref 0–40)
Albumin: 4.4 g/dL (ref 3.8–4.9)
Alkaline Phosphatase: 86 IU/L (ref 48–121)
Bilirubin Total: 0.8 mg/dL (ref 0.0–1.2)
Bilirubin, Direct: 0.29 mg/dL (ref 0.00–0.40)

## 2019-08-04 LAB — LIPASE: Lipase: 42 U/L (ref 13–78)

## 2019-09-18 ENCOUNTER — Other Ambulatory Visit: Payer: BLUE CROSS/BLUE SHIELD

## 2019-09-18 DIAGNOSIS — R7989 Other specified abnormal findings of blood chemistry: Secondary | ICD-10-CM

## 2019-09-19 LAB — HEPATIC FUNCTION PANEL
ALT: 77 IU/L — ABNORMAL HIGH (ref 0–44)
AST: 96 IU/L — ABNORMAL HIGH (ref 0–40)
Albumin: 4.7 g/dL (ref 3.8–4.9)
Alkaline Phosphatase: 80 IU/L (ref 44–121)
Bilirubin Total: 0.9 mg/dL (ref 0.0–1.2)
Bilirubin, Direct: 0.31 mg/dL (ref 0.00–0.40)
Total Protein: 7.6 g/dL (ref 6.0–8.5)

## 2019-09-21 ENCOUNTER — Ambulatory Visit: Payer: Self-pay

## 2019-09-21 NOTE — Telephone Encounter (Signed)
Patient returned call and was read lab note by Dr Ronnald Ramp written 09/19/19. He verbalized understanding. Best contact number [phone]. He will wait to hear from [place] time and date. He prefers somewhere close to office. Please order [place].

## 2019-09-21 NOTE — Addendum Note (Signed)
Addended by: Fredderick Severance on: 09/21/2019 03:30 PM   Modules accepted: Orders

## 2019-09-21 NOTE — Telephone Encounter (Signed)
I put the order in for [place]

## 2019-09-28 ENCOUNTER — Ambulatory Visit
Admission: RE | Admit: 2019-09-28 | Discharge: 2019-09-28 | Disposition: A | Discharge status: Home / Self Care | Payer: BLUE CROSS/BLUE SHIELD | Source: Ambulatory Visit | Attending: Family Medicine | Admitting: Family Medicine

## 2019-09-28 ENCOUNTER — Other Ambulatory Visit: Payer: Self-pay

## 2019-09-28 DIAGNOSIS — R7989 Other specified abnormal findings of blood chemistry: Secondary | ICD-10-CM | POA: Diagnosis not present

## 2019-10-05 ENCOUNTER — Ambulatory Visit (INDEPENDENT_AMBULATORY_CARE_PROVIDER_SITE_OTHER): Payer: BLUE CROSS/BLUE SHIELD | Admitting: Family Medicine

## 2019-10-05 ENCOUNTER — Other Ambulatory Visit: Payer: Self-pay

## 2019-10-05 ENCOUNTER — Encounter: Payer: Self-pay | Admitting: Family Medicine

## 2019-10-05 VITALS — BP 120/90 | HR 68 | Ht 73.0 in | Wt 147.0 lb

## 2019-10-05 DIAGNOSIS — Z1211 Encounter for screening for malignant neoplasm of colon: Secondary | ICD-10-CM | POA: Diagnosis not present

## 2019-10-05 DIAGNOSIS — K7010 Alcoholic hepatitis without ascites: Secondary | ICD-10-CM

## 2019-10-05 DIAGNOSIS — Z23 Encounter for immunization: Secondary | ICD-10-CM | POA: Diagnosis not present

## 2019-10-05 NOTE — Patient Instructions (Signed)
Alcoholic Hepatitis  Alcoholic hepatitis is liver inflammation that is caused by drinking a lot of alcohol over a long period of time. This inflammation decreases the liver's ability to function normally. This condition requires you to stop drinking alcohol permanently to prevent further damage. What are the causes? Alcoholic hepatitis is caused by long-term (chronic) heavy alcohol use. The liver filters alcohol out of the bloodstream. When alcohol gets divided into small particles (broken down) in the liver, substances are produced that can damage liver cells. This causes destruction of liver cells and inflammation. What increases the risk? The following factors may make you more likely to develop this condition:  Regularly drinking large amounts of alcohol, especially in a short amount of time (binge drinking).  Drinking heavily for years.  Being male.  Being obese.  Having had a hepatitis infection in the past.  Having a liver problem that you were born with (genetic liver disease).  Having a lack (deficiency) of certain nutrients, such as folate or thiamine.  Having a parent or sibling who has alcoholic hepatitis. What are the signs or symptoms? Symptoms of this condition include:  Pain and swelling in the abdomen.  Loss of appetite.  Losing weight without trying.  Nausea and vomiting.  Diarrhea.  Fever.  Fatigue.  Yellowing of the skin and the whites of the eyes (jaundice).  Veins that you can see ("spider veins"), especially in the abdomen.  Bleeding easily, such as excessive bleeding from a minor cut.  Itching.  Trouble thinking clearly.  Memory problems.  Mood changes.  Confusion. How is this diagnosed? This condition may be diagnosed with:  A physical exam and a review of medical history.  Blood tests to check liver function.  Tests that create detailed images of the body. These may include: ? A liver ultrasound. ? CT scan. ? MRI.  A  liver biopsy. For this test, a small sample of liver tissue is removed and checked for signs of liver damage. How is this treated? The most important part of treatment is to stop drinking alcohol. If you are addicted to alcohol, your health care provider will help you make a plan to quit. This plan may involve:  Taking medicine to decrease unpleasant symptoms that are caused by stopping or decreasing alcohol use (withdrawal symptoms).  Entering a treatment program to help you stop drinking.  Joining a support group. Treatment for alcoholic hepatitis may also include:  Steroid medicines to reduce inflammation.  Nutritional therapy. Your health care provider or a diet and nutrition specialist (dietitian) may recommend: ? Eating a healthy diet. ? Eating specific foods that contain vitamins and minerals to help you maintain nutrient levels in your body. ? Taking vitamins and dietary supplements to make sure you maintain nutrient levels in your body.  Receiving a donated liver (liver transplant). This is only done in very severe cases, and only for people who have completely stopped drinking and can commit to never drinking alcohol again. Follow these instructions at home:   Do not drink alcohol. Follow your treatment plan, and work with your health care provider as needed.  Consider joining an alcohol support group. These groups can provide emotional support and guidance.  Take over-the-counter and prescription medicines only as told by your health care provider. These include vitamins and supplements.  Do not use medicines or eat foods that contain alcohol unless told by your health care provider.  Follow instructions from your health care provider or dietitian about nutritional therapy.    Keep all follow-up visits as told by your health care provider. This is important. Contact a health care provider if:  You have a fever.  You have a decreased appetite.  You have flu-like  symptoms such as fatigue, weakness, or muscle aches.  You have nausea or vomiting.  You bruise easily.  Your urine is very dark.  You develop new pain in your abdomen. Get help right away if:  You vomit blood.  You develop jaundice.  You have severely itchy skin.  Your legs swell.  Your abdomen suddenly swells.  You have stools that are black, tar-like, or bloody.  You bleed easily, such as excessive bleeding from a minor cut.  You are confused or not thinking clearly.  You have a seizure. Summary  Alcoholic hepatitis is liver inflammation that is caused by drinking a lot of alcohol over a long period of time.  Alcoholic hepatitis is diagnosed with blood tests that check liver function.  The most important part of treatment is to stop drinking alcohol. Follow your treatment plan, and work with your health care provider as needed. This information is not intended to replace advice given to you by your health care provider. Make sure you discuss any questions you have with your health care provider. Document Revised: 04/11/2018 Document Reviewed: 09/02/2016 Elsevier Patient Education  2020 [employer]  

## 2019-10-05 NOTE — Progress Notes (Signed)
Date:  10/05/2019   Name:  Paul Logan   DOB:  [birth year]   MRN:  [id number]   Chief Complaint: consultation [place] (and labs- still drinking approx 30 beers per week) and Flu Vaccine  Patient is a 59 year old male who presents for a suspected alcohol induced liver concern exam. The patient reports the following problems: pain /mild RUQ. Health maintenance has been reviewed up to date.  Abdominal Pain This is a new problem. The current episode started more than 1 month ago. The onset quality is gradual. The problem occurs daily. The problem has been waxing and waning. The pain is located in the RUQ. The pain is mild. The quality of the pain is aching. Pertinent negatives include no constipation, diarrhea, dysuria, fever, frequency, headaches, hematuria, myalgias or nausea.    Lab Results  Component Value Date   CREATININE 0.90 10/24/2015   BUN 7 10/24/2015   NA 139 10/24/2015   K 4.3 10/24/2015   CL 99 10/24/2015   CO2 26 10/24/2015   Lab Results  Component Value Date   CHOL 164 10/24/2015   HDL 105 10/24/2015   LDLCALC 51 10/24/2015   TRIG 40 10/24/2015   CHOLHDL 1.6 10/24/2015   No results found for: TSH No results found for: HGBA1C Lab Results  Component Value Date   WBC 4.0 08/03/2019   HGB 16.6 08/03/2019   HCT 48.9 08/03/2019   MCV 96 08/03/2019   PLT 113 (L) 08/03/2019   Lab Results  Component Value Date   ALT 77 (H) 09/18/2019   AST 96 (H) 09/18/2019   ALKPHOS 80 09/18/2019   BILITOT 0.9 09/18/2019     Review of Systems  Constitutional: Negative for chills and fever.  HENT: Negative for drooling, ear discharge, ear pain and sore throat.   Respiratory: Negative for cough, shortness of breath and wheezing.   Cardiovascular: Negative for chest pain, palpitations and leg swelling.  Gastrointestinal: Positive for abdominal pain. Negative for blood in stool, constipation, diarrhea and nausea.  Endocrine: Negative for polydipsia.  Genitourinary: Negative  for dysuria, frequency, hematuria and urgency.  Musculoskeletal: Negative for back pain, myalgias and neck pain.  Skin: Negative for rash.  Allergic/Immunologic: Negative for environmental allergies.  Neurological: Negative for dizziness and headaches.  Hematological: Does not bruise/bleed easily.  Psychiatric/Behavioral: Negative for suicidal ideas. The patient is not nervous/anxious.     Patient Active Problem List   Diagnosis Date Noted  . Colon polyp, hyperplastic 05/10/2011  . Gastro-esophageal reflux disease without esophagitis 05/10/2011  . Tobacco abuse 05/10/2011    No Known Allergies  No past surgical history on file.  Social History   Tobacco Use  . Smoking status: Current Every Day Smoker    Packs/day: 1.50    Types: Cigarettes  . Smokeless tobacco: Never Used  . Tobacco comment: info given on pills and patches  Substance Use Topics  . Alcohol use: Yes    Alcohol/week: 30.0 standard drinks    Types: 30 Cans of beer per week  . Drug use: No     Medication list has been reviewed and updated.  No outpatient medications have been marked as taking for the 10/05/19 encounter (Office Visit) with Juline Patch, MD.    [facility] 2/9 Scores 10/05/2019 08/03/2019 10/25/2017  PHQ - 2 Score 0 0 0  PHQ- 9 Score 0 0 1    GAD 7 : Generalized Anxiety Score 10/05/2019 08/03/2019  Nervous, Anxious, on Edge 0 0  Control/stop worrying 0 0  Worry too much - different things 0 0  Trouble relaxing 0 0  Restless 0 0  Easily annoyed or irritable 0 0  Afraid - awful might happen 0 0  Total GAD 7 Score 0 0  Anxiety Difficulty - Not difficult at all    BP Readings from Last 3 Encounters:  10/05/19 120/90  08/03/19 (!) 146/92  10/25/17 138/80    Physical Exam Vitals and nursing note reviewed.  HENT:     Head: Normocephalic.     Right Ear: External ear normal.     Left Ear: External ear normal.     Nose: Nose normal. No congestion or rhinorrhea.     Mouth/Throat:      Mouth: Mucous membranes are moist.  Eyes:     General: No scleral icterus.       Right eye: No discharge.        Left eye: No discharge.     Conjunctiva/sclera: Conjunctivae normal.     Pupils: Pupils are equal, round, and reactive to light.  Neck:     Thyroid: No thyromegaly.     Vascular: No JVD.     Trachea: No tracheal deviation.  Cardiovascular:     Rate and Rhythm: Normal rate and regular rhythm.     Heart sounds: Normal heart sounds. No murmur heard.  No friction rub. No gallop.   Pulmonary:     Effort: No respiratory distress.     Breath sounds: Normal breath sounds. No wheezing or rales.  Abdominal:     General: Bowel sounds are normal.     Palpations: Abdomen is soft. There is no mass.     Tenderness: There is no abdominal tenderness. There is no guarding or rebound.  Musculoskeletal:        General: No tenderness. Normal range of motion.     Cervical back: Normal range of motion and neck supple.  Lymphadenopathy:     Cervical: No cervical adenopathy.  Skin:    General: Skin is warm.     Findings: No rash.  Neurological:     Mental Status: He is alert and oriented to person, place, and time.     Cranial Nerves: No cranial nerve deficit.     Deep Tendon Reflexes: Reflexes are normal and symmetric.     Wt Readings from Last 3 Encounters:  10/05/19 147 lb (66.7 kg)  08/03/19 150 lb (68 kg)  10/25/17 153 lb (69.4 kg)    BP 120/90   Pulse 68   Ht 6\' 1"  (1.854 m)   Wt 147 lb (66.7 kg)   BMI 19.39 kg/m   Assessment and Plan: 1. Alcoholic hepatitis, unspecified whether ascites present Chronic.  Persistent.  On evaluation of pain that is in the right upper lobe quadrant we did LFTs and lipase.  There is some mild elevation of the transaminases.  Ultrasound of the liver notes that there is some echogenicity suggesting of some fatty infiltration of the liver.  Review of his lipids are excellent.  This is probably secondary to the fact that he drinks a significant  amount of beer on a daily basis.  We had a long discussion about alcoholic hepatitis and the for bearing of developing cirrhosis.  We will refer to gastroenterology because he needs an upcoming colonoscopy but also bring to the attention to gastroenterology the potential development of this concern.  In the meantime I have instructed the patient to cut back on his alcohol  and eventually consider curtailing this. - Ambulatory referral to Gastroenterology  2. Screen for colon cancer Discussed and appointment made with gastroenterology. - Ambulatory referral to Gastroenterology  3. Need for immunization against influenza Discussed and administered. - Flu Vaccine QUAD 36+ mos IM

## 2019-10-22 ENCOUNTER — Encounter: Payer: Self-pay | Admitting: *Deleted

## 2019-10-24 DIAGNOSIS — F1010 Alcohol abuse, uncomplicated: Secondary | ICD-10-CM | POA: Insufficient documentation

## 2019-10-30 ENCOUNTER — Ambulatory Visit: Payer: BLUE CROSS/BLUE SHIELD | Admitting: Gastroenterology

## 2019-10-30 ENCOUNTER — Encounter: Payer: Self-pay | Admitting: *Deleted

## 2020-09-19 ENCOUNTER — Telehealth: Payer: Self-pay

## 2020-09-19 NOTE — Telephone Encounter (Signed)
Called pt and gave him Dr Dorothey Baseman number to call and reschedule the appt from last year

## 2022-02-19 ENCOUNTER — Ambulatory Visit: Payer: Self-pay

## 2022-02-19 ENCOUNTER — Ambulatory Visit: Payer: BLUE CROSS/BLUE SHIELD | Admitting: Family Medicine

## 2022-02-19 NOTE — Telephone Encounter (Signed)
Chief Complaint: lung pain with coughing/sneezing Symptoms: No pain at present, pain 3-4/10 when cough and lasts a few seconds Frequency: 1 week onset Pertinent Negatives: Patient denies chest pain at present, no SOB, no other symptoms Disposition: []$ ED /[]$ Urgent Care (no appt availability in office) / [x]$ Appointment(In office/virtual)/ []$  [facility]/ []$ Home Care/ []$ Refused Recommended Disposition /[]$ [place] [facility]/ []$  Follow-up with PCP Additional Notes: Advised OV, asks for afternoon appointment on Tuesday of next week. Advised ED if worsening symptoms.    Reason for Disposition  [1] Chest pain(s) lasting a few seconds from coughing AND [2] persists > 3 days  Answer Assessment - Initial Assessment Questions 1. LOCATION: "Where does it hurt?"       Lungs 2. RADIATION: "Does the pain go anywhere else?" (e.g., into neck, jaw, arms, back)     Under the shoulder blade on the right side 3. ONSET: "When did the chest pain begin?" (Minutes, hours or days)      1 week ago lung pain when cough/sneeze 4. PATTERN: "Does the pain come and go, or has it been constant since it started?"  "Does it get worse with exertion?"      With coughing 5. DURATION: "How long does it last" (e.g., seconds, minutes, hours)     Lasts a few seconds when sneezing/coughing 6. SEVERITY: "How bad is the pain?"  (e.g., Scale 1-10; mild, moderate, or severe)    - MILD (1-3): doesn't interfere with normal activities     - MODERATE (4-7): interferes with normal activities or awakens from sleep    - SEVERE (8-10): excruciating pain, unable to do any normal activities       3-4 7. CARDIAC RISK FACTORS: "Do you have any history of heart problems or risk factors for heart disease?" (e.g., angina, prior heart attack; diabetes, high blood pressure, high cholesterol, smoker, or strong family history of heart disease)     No 8. PULMONARY RISK FACTORS: "Do you have any history of lung disease?"  (e.g., blood  clots in lung, asthma, emphysema, birth control pills)     No 9. CAUSE: "What do you think is causing the chest pain?"     I don't know 10. OTHER SYMPTOMS: "Do you have any other symptoms?" (e.g., dizziness, nausea, vomiting, sweating, fever, difficulty breathing, cough)       Cough, sneezing  Protocols used: Chest Pain-A-AH

## 2022-02-23 ENCOUNTER — Encounter: Payer: Self-pay | Admitting: Family Medicine

## 2022-02-23 ENCOUNTER — Ambulatory Visit
Admission: RE | Admit: 2022-02-23 | Discharge: 2022-02-23 | Disposition: A | Discharge status: Home / Self Care | Payer: BLUE CROSS/BLUE SHIELD | Attending: Family Medicine | Admitting: Family Medicine

## 2022-02-23 ENCOUNTER — Ambulatory Visit
Admission: RE | Admit: 2022-02-23 | Discharge: 2022-02-23 | Disposition: A | Discharge status: Home / Self Care | Payer: BLUE CROSS/BLUE SHIELD | Source: Ambulatory Visit | Attending: Family Medicine | Admitting: Family Medicine

## 2022-02-23 ENCOUNTER — Ambulatory Visit: Payer: BLUE CROSS/BLUE SHIELD | Admitting: Family Medicine

## 2022-02-23 VITALS — BP 124/78 | HR 93 | Ht 73.0 in | Wt 150.0 lb

## 2022-02-23 DIAGNOSIS — M549 Dorsalgia, unspecified: Secondary | ICD-10-CM | POA: Insufficient documentation

## 2022-02-23 MED ORDER — MELOXICAM 15 MG PO TABS: 15.0000 mg | ORAL_TABLET | Freq: Every day | ORAL | 1 refills | Status: DC

## 2022-02-23 NOTE — Progress Notes (Unsigned)
Date:  02/23/2022   Name:  Paul Logan   DOB:  01/25/1960   MRN:  [id number]   Chief Complaint: Back Pain (Under shoulder blades on both sides- coughing or sneezing makes it worse x 1 month)  Back Pain This is a new problem. The current episode started more than 1 month ago. The problem occurs constantly. The pain is present in the thoracic spine (right scapula). The quality of the pain is described as stabbing Tax inspector"). The pain is severe. The symptoms are aggravated by coughing (sneezing). Pertinent negatives include no abdominal pain, chest pain, dysuria, fever, headaches, numbness or paresthesias.    Lab Results  Component Value Date   NA 139 10/24/2015   K 4.3 10/24/2015   CO2 26 10/24/2015   GLUCOSE 85 10/24/2015   BUN 7 10/24/2015   CREATININE 0.90 10/24/2015   CALCIUM 8.6 (L) 10/24/2015   GFRNONAA 95 10/24/2015   Lab Results  Component Value Date   CHOL 164 10/24/2015   HDL 105 10/24/2015   LDLCALC 51 10/24/2015   TRIG 40 10/24/2015   CHOLHDL 1.6 10/24/2015   No results found for: "TSH" No results found for: "HGBA1C" Lab Results  Component Value Date   WBC 4.0 08/03/2019   HGB 16.6 08/03/2019   HCT 48.9 08/03/2019   MCV 96 08/03/2019   PLT 113 (L) 08/03/2019   Lab Results  Component Value Date   ALT 77 (H) 09/18/2019   AST 96 (H) 09/18/2019   ALKPHOS 80 09/18/2019   BILITOT 0.9 09/18/2019   No results found for: "25OHVITD2", "25OHVITD3", "VD25OH"   Review of Systems  Constitutional:  Negative for chills and fever.  HENT:  Negative for drooling, ear discharge, ear pain, sinus pressure and sore throat.   Respiratory:  Negative for cough, shortness of breath and wheezing.   Cardiovascular:  Negative for chest pain, palpitations and leg swelling.  Gastrointestinal:  Negative for abdominal pain, blood in stool, constipation, diarrhea and nausea.  Endocrine: Negative for polydipsia.  Genitourinary:  Negative for dysuria, frequency, hematuria and  urgency.  Musculoskeletal:  Positive for back pain. Negative for myalgias and neck pain.  Skin:  Negative for rash.  Allergic/Immunologic: Negative for environmental allergies.  Neurological:  Negative for dizziness, numbness, headaches and paresthesias.  Hematological:  Does not bruise/bleed easily.  Psychiatric/Behavioral:  Negative for suicidal ideas. The patient is not nervous/anxious.     Patient Active Problem List   Diagnosis Date Noted   Alcohol abuse 10/24/2019   Colon polyp, hyperplastic 05/10/2011   Gastro-esophageal reflux disease without esophagitis 05/10/2011   Tobacco abuse 05/10/2011    No Known Allergies  No past surgical history on file.  Social History   Tobacco Use   Smoking status: Every Day    Packs/day: 1.50    Types: Cigarettes   Smokeless tobacco: Never   Tobacco comments:    info given on pills and patches  Substance Use Topics   Alcohol use: Yes    Alcohol/week: 30.0 standard drinks of alcohol    Types: 30 Cans of beer per week   Drug use: No     Medication list has been reviewed and updated.  No outpatient medications have been marked as taking for the 02/23/22 encounter (Office Visit) with Juline Patch, MD.       02/23/2022    3:31 PM 10/05/2019    2:07 PM 08/03/2019    3:00 PM  GAD 7 : Generalized Anxiety Score  Nervous,  Anxious, on Edge 0 0 0  Control/stop worrying 0 0 0  Worry too much - different things 0 0 0  Trouble relaxing 0 0 0  Restless 0 0 0  Easily annoyed or irritable 0 0 0  Afraid - awful might happen 0 0 0  Total GAD 7 Score 0 0 0  Anxiety Difficulty Not difficult at all  Not difficult at all       02/23/2022    3:31 PM 10/05/2019    2:07 PM 08/03/2019    3:00 PM  Depression screen PHQ 2/9  Decreased Interest 0 0 0  Down, Depressed, Hopeless 0 0 0  PHQ - 2 Score 0 0 0  Altered sleeping 0 0 0  Tired, decreased energy 0 0 0  Change in appetite 0 0 0  Feeling bad or failure about yourself  0 0 0  Trouble  concentrating 0 0 0  Moving slowly or fidgety/restless 0 0 0  Suicidal thoughts 0 0 0  PHQ-9 Score 0 0 0  Difficult doing work/chores Not difficult at all  Not difficult at all    BP Readings from Last 3 Encounters:  02/23/22 124/78  10/05/19 120/90  08/03/19 (!) 146/92    Physical Exam Vitals and nursing note reviewed.  HENT:     Head: Normocephalic.     Right Ear: External ear normal.     Left Ear: External ear normal.     Nose: Nose normal.  Eyes:     General: No scleral icterus.       Right eye: No discharge.        Left eye: No discharge.     Conjunctiva/sclera: Conjunctivae normal.     Pupils: Pupils are equal, round, and reactive to light.  Neck:     Thyroid: No thyromegaly.     Vascular: No JVD.     Trachea: No tracheal deviation.  Cardiovascular:     Rate and Rhythm: Normal rate and regular rhythm.     Chest Wall: PMI is not displaced. No thrill.     Pulses: Normal pulses.     Heart sounds: Normal heart sounds, S1 normal and S2 normal. No murmur heard.    No systolic murmur is present.     No diastolic murmur is present.     No friction rub. No gallop. No S3 or S4 sounds.  Pulmonary:     Effort: No respiratory distress.     Breath sounds: Normal breath sounds. No decreased air movement. No decreased breath sounds, wheezing, rhonchi or rales.  Chest:  Breasts:    Right: Tenderness present.     Comments: Tender right costovertebral joint Abdominal:     General: Bowel sounds are normal.     Palpations: Abdomen is soft. There is no mass.     Tenderness: There is no abdominal tenderness. There is no guarding or rebound.  Musculoskeletal:        General: No tenderness. Normal range of motion.     Cervical back: Normal range of motion and neck supple.  Lymphadenopathy:     Cervical: No cervical adenopathy.  Skin:    General: Skin is warm.     Findings: No rash.  Neurological:     Mental Status: He is alert and oriented to person, place, and time.      Cranial Nerves: No cranial nerve deficit.     Deep Tendon Reflexes: Reflexes are normal and symmetric.     Wt Readings from  Last 3 Encounters:  02/23/22 150 lb (68 kg)  10/05/19 147 lb (66.7 kg)  08/03/19 150 lb (68 kg)    BP 124/78   Pulse 93   Ht 6' 1"$  (1.854 m)   Wt 150 lb (68 kg)   SpO2 98%   BMI 19.79 kg/m   Assessment and Plan:  1. Costovertebral angle tenderness New onset.  Tenderness in the rhomboid and 7 costovertebral joint noted consistent with chest wall concerns such as inflammation of the costovertebral joint or intercostal rhomboid strain.  Will initiate meloxicam 15 mg once a day and if continued patient may return.  We will obtain x-ray to rule out any osteolytic or osteoblastic concerns. - meloxicam (MOBIC) 15 MG tablet; Take 1 tablet (15 mg total) by mouth daily.  Dispense: 30 tablet; Refill: 1 - DG Thoracic Spine 2 View    Otilio Miu, MD

## 2022-02-24 ENCOUNTER — Encounter: Payer: Self-pay | Admitting: Family Medicine

## 2022-12-05 DEATH — deceased

## 2022-12-13 ENCOUNTER — Telehealth: Payer: Self-pay | Admitting: Family Medicine

## 2022-12-13 NOTE — Telephone Encounter (Signed)
Deanna w/ [facility] in [place].  Pt passed away 12-05-2022, and the death cert is ready to be signed in [place].  Deanna reports that the family is waiting for cremation. Please advise does Dr. Yetta Barre use [facility]  CB [phone]

## 2022-12-14 NOTE — Telephone Encounter (Signed)
Copied from CRM [id number]. Topic: General - Inquiry >> Dec 08, 2022 10:44 AM Payton Doughty wrote: Reason for CRM: Deanna w/ [facility] in [place].  Pt passed away December 15, 2022, and the death cert is ready to be signed in [facility]. CB [phone] >> Dec 10, 2022 10:59 AM Patsy Lager T wrote: Jennette Kettle from [facility] is still waiting on death cert to be signed as patient passed on 12/15/22 and family needs the death cert

## 2022-12-14 NOTE — Telephone Encounter (Signed)
Dr. Yetta Barre spoke to someone on 12/13/22. Dr. Yetta Barre has not seen this patient a lot. She stated that to the funeral home that she didn't have much medical history for the pt.  KP
